# Patient Record
Sex: Male | Born: 1970 | Race: White | Hispanic: No | Marital: Married | Smoking: Never smoker
Health system: Southern US, Community
[De-identification: ages and names within clinical notes are randomized; demographics above are authoritative.]

## PROBLEM LIST (undated history)

## (undated) HISTORY — PX: VASECTOMY: SHX75

---

## 2005-09-21 ENCOUNTER — Emergency Department (HOSPITAL_COMMUNITY): Admission: EM | Admit: 2005-09-21 | Discharge: 2005-09-21 | Payer: Self-pay | Admitting: Family Medicine

## 2005-09-21 ENCOUNTER — Emergency Department (HOSPITAL_COMMUNITY): Admission: EM | Admit: 2005-09-21 | Discharge: 2005-09-21 | Payer: Self-pay | Admitting: *Deleted

## 2006-03-22 ENCOUNTER — Encounter: Admission: RE | Admit: 2006-03-22 | Discharge: 2006-03-22 | Payer: Self-pay | Admitting: Family Medicine

## 2009-08-13 ENCOUNTER — Emergency Department (HOSPITAL_COMMUNITY): Admission: EM | Admit: 2009-08-13 | Discharge: 2009-08-13 | Payer: Self-pay | Admitting: Family Medicine

## 2009-08-16 ENCOUNTER — Encounter: Admission: RE | Admit: 2009-08-16 | Discharge: 2009-08-16 | Payer: Self-pay | Admitting: Orthopedic Surgery

## 2011-05-18 IMAGING — CR DG KNEE COMPLETE 4+V*L*
4 series · 4 of 4 positions shown · non-contrast
Comparison: None

CLINICAL DATA: Pain

LEFT KNEE - COMPLETE 4+ VIEW

[view not recorded (1 of 4)]
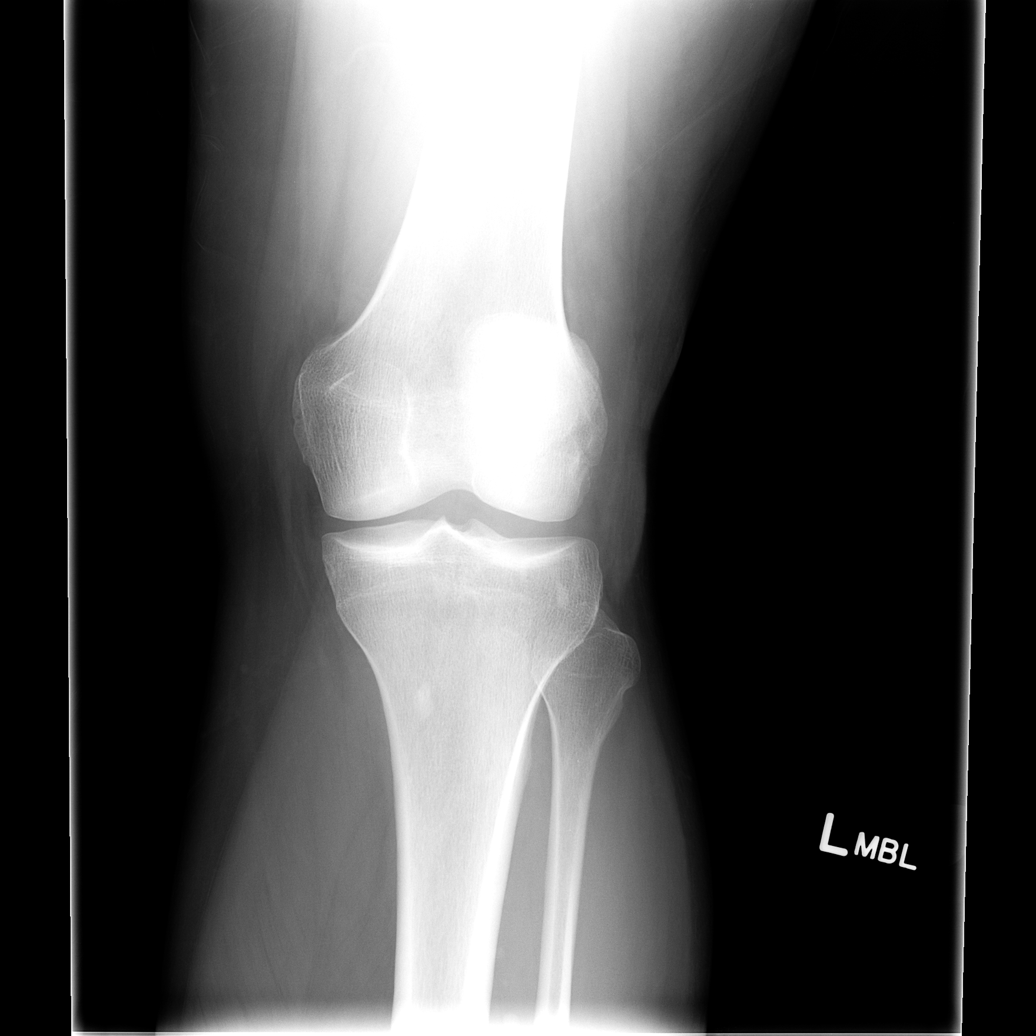

[view not recorded (2 of 4)]
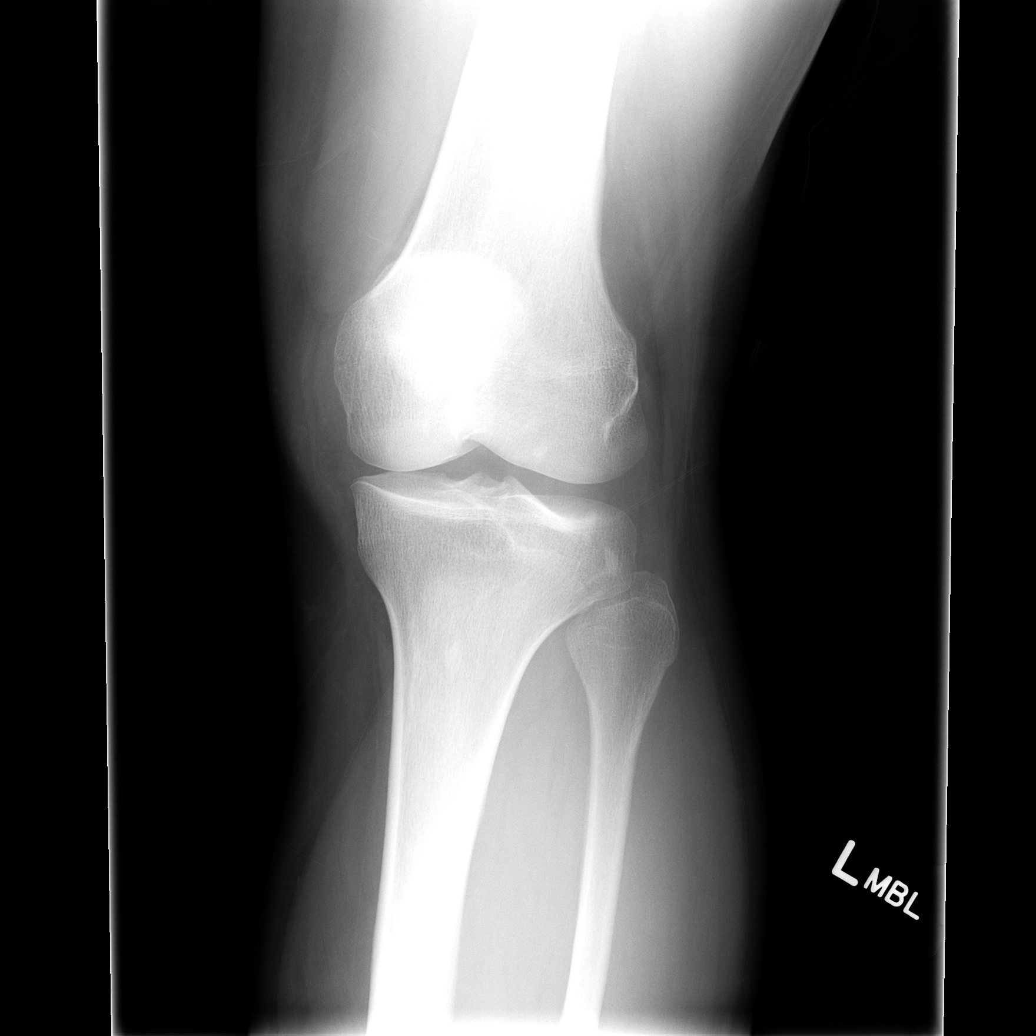

[view not recorded (3 of 4)]
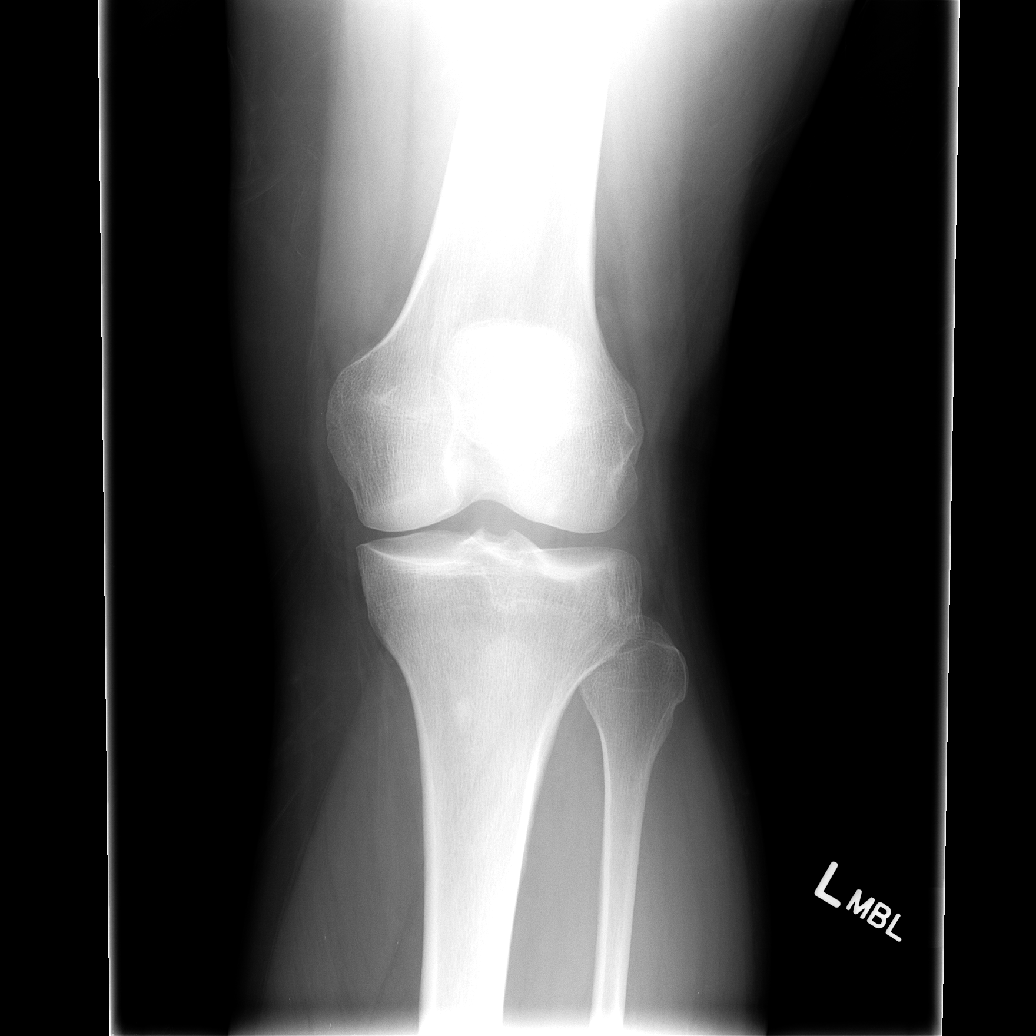

[view not recorded (4 of 4)]
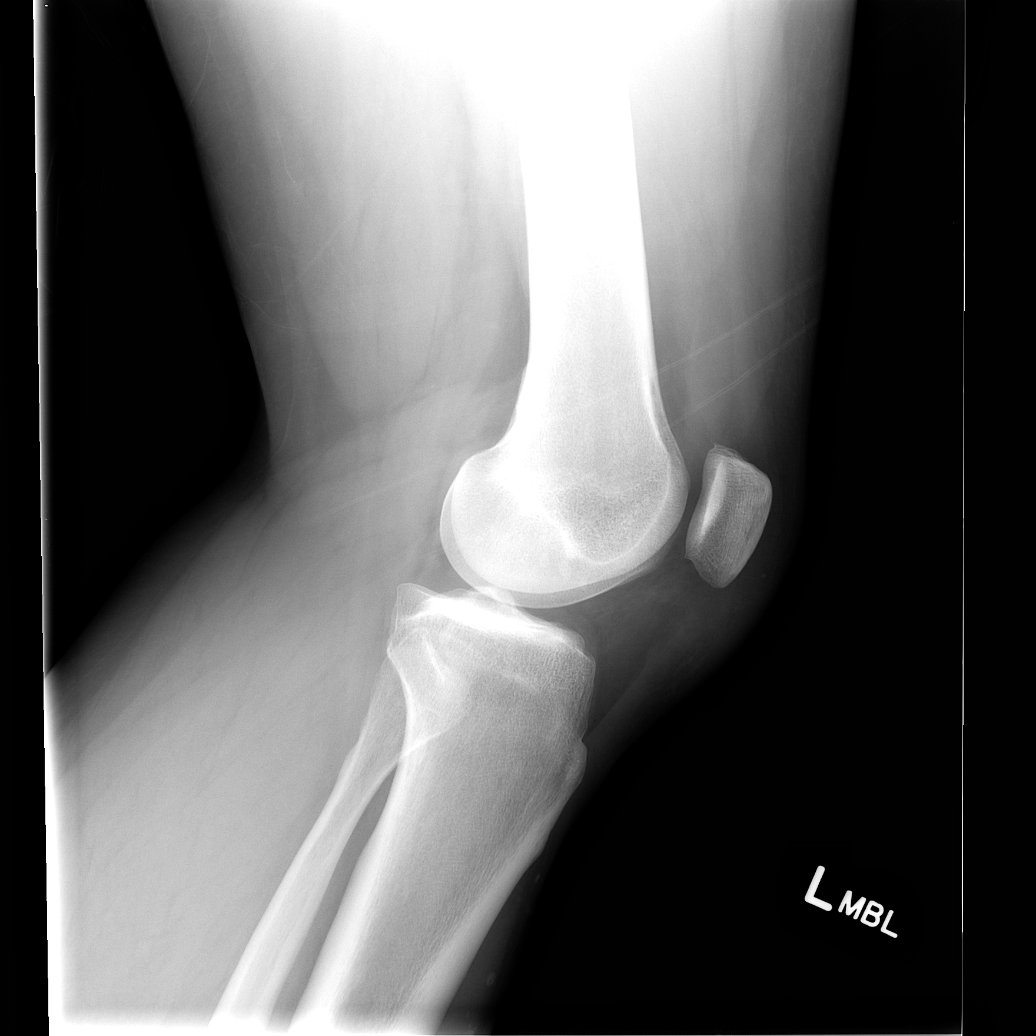

[4 of 4 positions shown; findings below may reference images not displayed]

FINDINGS: Four views of the left knee submitted.  No acute fracture
or subluxation.  No radiopaque foreign body.  No significant joint
effusion.
IMPRESSION: No acute fracture or subluxation.

## 2013-08-31 ENCOUNTER — Emergency Department (HOSPITAL_COMMUNITY)
Admission: EM | Admit: 2013-08-31 | Discharge: 2013-08-31 | Disposition: A | Discharge status: Home / Self Care | Payer: 59 | Attending: Emergency Medicine | Admitting: Emergency Medicine

## 2013-08-31 ENCOUNTER — Encounter (HOSPITAL_COMMUNITY): Payer: Self-pay | Admitting: Emergency Medicine

## 2013-08-31 DIAGNOSIS — Z7982 Long term (current) use of aspirin: Secondary | ICD-10-CM | POA: Insufficient documentation

## 2013-08-31 DIAGNOSIS — F410 Panic disorder [episodic paroxysmal anxiety] without agoraphobia: Secondary | ICD-10-CM | POA: Insufficient documentation

## 2013-08-31 DIAGNOSIS — R209 Unspecified disturbances of skin sensation: Secondary | ICD-10-CM | POA: Insufficient documentation

## 2013-08-31 DIAGNOSIS — R0602 Shortness of breath: Secondary | ICD-10-CM | POA: Insufficient documentation

## 2013-08-31 NOTE — ED Provider Notes (Signed)
CSN: [id number]     Arrival date & time 08/31/13  0147 History   First MD Initiated Contact with Patient 08/31/13 0158     Chief Complaint  Patient presents with  . Arm Pain   (Consider location/radiation/quality/duration/timing/severity/associated sxs/prior Treatment) HPI Comments: Had shortness of breath and left arm tingling while resting and getting ready for his week. He states a similar episode last week.   Patient is a 42 y.o. male presenting with arm pain. The history is provided by the patient.  Arm Pain This is a new problem. The current episode started 3 to 5 hours ago. Episode frequency: once. The problem has been resolved. Associated symptoms include shortness of breath (lasted about 15 minutes). Pertinent negatives include no chest pain, no abdominal pain and no headaches. Nothing aggravates the symptoms. Nothing relieves the symptoms.    No past medical history on file. No past surgical history on file. Family History  Problem Relation Age of Onset  . Cancer Mother   . Heart failure Father   . Hypertension Father    History  Substance Use Topics  . Smoking status: Never Smoker   . Smokeless tobacco: Never Used  . Alcohol Use: No    Review of Systems  Constitutional: Negative for fever.  Respiratory: Positive for shortness of breath (lasted about 15 minutes).   Cardiovascular: Negative for chest pain.  Gastrointestinal: Negative for abdominal pain.  Neurological: Negative for headaches.  All other systems reviewed and are negative.    Allergies  Review of patient's allergies indicates no known allergies.  Home Medications   Current Outpatient Rx  Name  Route  Sig  Dispense  Refill  . aspirin 325 MG tablet   Oral   Take 975 mg by mouth once.          BP 128/89  Pulse 82  Temp(Src) 98.5 F (36.9 C)  Resp 16  SpO2 97% Physical Exam  Nursing note and vitals reviewed. Constitutional: He is oriented to person, place, and time. He appears  well-developed and well-nourished. No distress.  HENT:  Head: Normocephalic and atraumatic.  Mouth/Throat: No oropharyngeal exudate.  Eyes: EOM are normal. Pupils are equal, round, and reactive to light.  Neck: Normal range of motion. Neck supple.  Cardiovascular: Normal rate and regular rhythm.  Exam reveals no friction rub.   No murmur heard. Pulmonary/Chest: Effort normal and breath sounds normal. No respiratory distress. He has no wheezes. He has no rales.  Abdominal: He exhibits no distension. There is no tenderness. There is no rebound.  Musculoskeletal: Normal range of motion. He exhibits no edema.  Neurological: He is alert and oriented to person, place, and time.  Skin: He is not diaphoretic.    ED Course  Procedures (including critical care time) Labs Review Labs Reviewed - No data to display Imaging Review No results found.  EKG Interpretation     Ventricular Rate:  73 PR Interval:  164 QRS Duration: 91 QT Interval:  374 QTC Calculation: 412 R Axis:   55 Text Interpretation:  Sinus rhythm Baseline wander in lead(s) V3            MDM   1. Panic attack    29-year-old male with no prior history presents with some shortness of breath and left arm tingling. Began all he was taking but is weak and relaxing. No chest pain, or shortness of breath that has since eased off. Last about 15 minutes. Tingling is very mildly tender, but is much  subsided. Patient's syncopal episode like this last week that lasted a longer amount of time. It is very similar with shortness of breath and left arm tingling. He denies any fever, vomiting, chills, cough. He has no risk factors for ACS other than family history and obesity. He is not a smoker. EKG is normal here. I feel he patient's symptoms are consistent with a panic attack. He wants to make sure it is not his heart, so I offered him serial troponin testing, however he wants to pursue a workup with his PCP. Instructed to followup  with PCP    Dagmar Hait, MD 08/31/13 [phone]

## 2013-08-31 NOTE — ED Notes (Signed)
Dr. Walden at bedside 

## 2013-08-31 NOTE — ED Notes (Signed)
Pt states left upper arm pain that radiates to left hand which is tingling. Left sensation is present. Full range of motion present with arm, elbow, wris,t and fingers. Pt states that he feels like he can not get full expansion of his lungs.

## 2014-11-06 ENCOUNTER — Encounter (HOSPITAL_COMMUNITY): Payer: Self-pay | Admitting: *Deleted

## 2014-11-06 ENCOUNTER — Emergency Department (HOSPITAL_COMMUNITY)
Admission: EM | Admit: 2014-11-06 | Discharge: 2014-11-06 | Disposition: A | Discharge status: Home / Self Care | Payer: 59 | Source: Home / Self Care | Attending: Emergency Medicine | Admitting: Emergency Medicine

## 2014-11-06 DIAGNOSIS — K5901 Slow transit constipation: Secondary | ICD-10-CM

## 2014-11-06 DIAGNOSIS — M545 Low back pain, unspecified: Secondary | ICD-10-CM

## 2014-11-06 DIAGNOSIS — S39012A Strain of muscle, fascia and tendon of lower back, initial encounter: Secondary | ICD-10-CM

## 2014-11-06 MED ORDER — KETOROLAC TROMETHAMINE 10 MG PO TABS: 10.0000 mg | ORAL_TABLET | Freq: Four times a day (QID) | ORAL | Status: AC | PRN

## 2014-11-06 MED ORDER — KETOROLAC TROMETHAMINE 60 MG/2ML IM SOLN
INTRAMUSCULAR | Status: AC
  Filled 2014-11-06: qty 2

## 2014-11-06 MED ORDER — KETOROLAC TROMETHAMINE 60 MG/2ML IM SOLN
60.0000 mg | Freq: Once | INTRAMUSCULAR | Status: AC
  Administered 2014-11-06: 60 mg via INTRAMUSCULAR

## 2014-11-06 MED ORDER — DICLOFENAC SODIUM 1 % TD GEL: 1.0000 "application " | Freq: Four times a day (QID) | TRANSDERMAL | Status: AC

## 2014-11-06 NOTE — ED Notes (Signed)
Pt   Reports  He  Has  Low  Back  Pain  For  About  1  Week         Pain  Worse  On  Movement  And  Certain posistions          denys  Any  specefic  Injury         He  Is   Constipated   No  bm  For   sev  Days     -  He  Reports  Taking pain meds  For the  Constipation    Recent  Vasectomy  But  denys  Any  Symptoms  Related  To that

## 2014-11-06 NOTE — Discharge Instructions (Signed)
Back Pain, Adult °Low back pain is very common. About 1 in 5 people have back pain. The cause of low back pain is rarely dangerous. The pain often gets better over time. About half of people with a sudden onset of back pain feel better in just 2 weeks. About 8 in 10 people feel better by 6 weeks.  °CAUSES °Some common causes of back pain include: °· Strain of the muscles or ligaments supporting the spine. °· Wear and tear (degeneration) of the spinal discs. °· Arthritis. °· Direct injury to the back. °DIAGNOSIS °Most of the time, the direct cause of low back pain is not known. However, back pain can be treated effectively even when the exact cause of the pain is unknown. Answering your caregiver's questions about your overall health and symptoms is one of the most accurate ways to make sure the cause of your pain is not dangerous. If your caregiver needs more information, he or she may order lab work or imaging tests (X-rays or MRIs). However, even if imaging tests show changes in your back, this usually does not require surgery. °HOME CARE INSTRUCTIONS °For many people, back pain returns. Since low back pain is rarely dangerous, it is often a condition that people can learn to manage on their own.  °· Remain active. It is stressful on the back to sit or stand in one place. Do not sit, drive, or stand in one place for more than 30 minutes at a time. Take short walks on level surfaces as soon as pain allows. Try to increase the length of time you walk each day. °· Do not stay in bed. Resting more than 1 or 2 days can delay your recovery. °· Do not avoid exercise or work. Your body is made to move. It is not dangerous to be active, even though your back may hurt. Your back will likely heal faster if you return to being active before your pain is gone. °· Pay attention to your body when you  bend and lift. Many people have less discomfort when lifting if they bend their knees, keep the load close to their bodies, and  avoid twisting. Often, the most comfortable positions are those that put less stress on your recovering back. °· Find a comfortable position to sleep. Use a firm mattress and lie on your side with your knees slightly bent. If you lie on your back, put a pillow under your knees. °· Only take over-the-counter or prescription medicines as directed by your caregiver. Over-the-counter medicines to reduce pain and inflammation are often the most helpful. Your caregiver may prescribe muscle relaxant drugs. These medicines help dull your pain so you can more quickly return to your normal activities and healthy exercise. °· Put ice on the injured area. °¨ Put ice in a plastic bag. °¨ Place a towel between your skin and the bag. °¨ Leave the ice on for 15-20 minutes, 03-04 times a day for the first 2 to 3 days. After that, ice and heat may be alternated to reduce pain and spasms. °· Ask your caregiver about trying back exercises and gentle massage. This may be of some benefit. °· Avoid feeling anxious or stressed. Stress increases muscle tension and can worsen back pain. It is important to recognize when you are anxious or stressed and learn ways to manage it. Exercise is a great option. °SEEK MEDICAL CARE IF: °· You have pain that is not relieved with rest or medicine. °· You have pain that does not improve in 1 week. °· You have new symptoms. °· You are generally not feeling well. °SEEK   IMMEDIATE MEDICAL CARE IF:   You have pain that radiates from your back into your legs.  You develop new bowel or bladder control problems.  You have unusual weakness or numbness in your arms or legs.  You develop nausea or vomiting.  You develop abdominal pain.  You feel faint. Document Released: 10/15/2005 Document Revised: 04/15/2012 Document Reviewed: 02/16/2014 [facility] Patient Information 2015 [place], [place]. This information is not intended to replace advice given to you by your health care provider. Make sure you  discuss any questions you have with your health care provider.  Constipation Miralax as directed, 2 glasses today and repeat every day as needed. Ducolax 1-2 tablets today then 1 a day as needed Lumbosacral Strain Lumbosacral strain is a strain of any of the parts that make up your lumbosacral vertebrae. Your lumbosacral vertebrae are the bones that make up the lower third of your backbone. Your lumbosacral vertebrae are held together by muscles and tough, fibrous tissue (ligaments).  CAUSES  A sudden blow to your back can cause lumbosacral strain. Also, anything that causes an excessive stretch of the muscles in the low back can cause this strain. This is typically seen when people exert themselves strenuously, fall, lift heavy objects, bend, or crouch repeatedly. RISK FACTORS  Physically demanding work.  Participation in pushing or pulling sports or sports that require a sudden twist of the back (tennis, golf, baseball).  Weight lifting.  Excessive lower back curvature.  Forward-tilted pelvis.  Weak back or abdominal muscles or both.  Tight hamstrings. SIGNS AND SYMPTOMS  Lumbosacral strain may cause pain in the area of your injury or pain that moves (radiates) down your leg.  DIAGNOSIS Your health care provider can often diagnose lumbosacral strain through a physical exam. In some cases, you may need tests such as X-ray exams.  TREATMENT  Treatment for your lower back injury depends on many factors that your clinician will have to evaluate. However, most treatment will include the use of anti-inflammatory medicines. HOME CARE INSTRUCTIONS   Avoid hard physical activities (tennis, racquetball, waterskiing) if you are not in proper physical condition for it. This may aggravate or create problems.  If you have a back problem, avoid sports requiring sudden body movements. Swimming and walking are generally safer activities.  Maintain good posture.  Maintain a healthy  weight.  For acute conditions, you may put ice on the injured area.  Put ice in a plastic bag.  Place a towel between your skin and the bag.  Leave the ice on for 20 minutes, 2-3 times a day.  When the low back starts healing, stretching and strengthening exercises may be recommended. SEEK MEDICAL CARE IF:  Your back pain is getting worse.  You experience severe back pain not relieved with medicines. SEEK IMMEDIATE MEDICAL CARE IF:   You have numbness, tingling, weakness, or problems with the use of your arms or legs.  There is a change in bowel or bladder control.  You have increasing pain in any area of the body, including your belly (abdomen).  You notice shortness of breath, dizziness, or feel faint.  You feel sick to your stomach (nauseous), are throwing up (vomiting), or become sweaty.  You notice discoloration of your toes or legs, or your feet get very cold. MAKE SURE YOU:   Understand these instructions.  Will watch your condition.  Will get help right away if you are not doing well or get worse. Document Released: 07/25/2005 Document Revised: 10/20/2013 Document Reviewed:  06/03/2013 [employer] Patient Information 2015 [place]. This information is not intended to replace advice given to you by your health care provider. Make sure you discuss any questions you have with your health care provider.  Constipation is when a person:  Poops (has a bowel movement) less than 3 times a week.  Has a hard time pooping.  Has poop that is dry, hard, or bigger than normal. HOME CARE   Eat foods with a lot of fiber in them. This includes fruits, vegetables, beans, and whole grains such as brown rice.  Avoid fatty foods and foods with a lot of sugar. This includes french fries, hamburgers, cookies, candy, and soda.  If you are not getting enough fiber from food, take products with added fiber in them (supplements).  Drink enough fluid to keep your pee (urine) clear  or pale yellow.  Exercise on a regular basis, or as told by your doctor.  Go to the restroom when you feel like you need to poop. Do not hold it.  Only take medicine as told by your doctor. Do not take medicines that help you poop (laxatives) without talking to your doctor first. GET HELP RIGHT AWAY IF:   You have bright red blood in your poop (stool).  Your constipation lasts more than 4 days or gets worse.  You have belly (abdominal) or butt (rectal) pain.  You have thin poop (as thin as a pencil).  You lose weight, and it cannot be explained. MAKE SURE YOU:   Understand these instructions.  Will watch your condition.  Will get help right away if you are not doing well or get worse. Document Released: 04/02/2008 Document Revised: 10/20/2013 Document Reviewed: 07/27/2013 [facility] Patient Information 2015 [place], [place]. This information is not intended to replace advice given to you by your health care provider. Make sure you discuss any questions you have with your health care provider.

## 2014-11-06 NOTE — ED Provider Notes (Signed)
CSN: [id number]     Arrival date & time 11/06/14  1458 History   First MD Initiated Contact with Patient 11/06/14 1628     Chief Complaint  Patient presents with  . Back Pain   (Consider location/radiation/quality/duration/timing/severity/associated sxs/prior Treatment) HPI Comments: 44 year old obese male complaining of back pain for one week. He saw his chiropractic sometime since the first of the week but apparently had little to no improvement. He does not understand why he has back pain. Denies trauma. He does state that just prior to the onset he had been assisting with hanging tram molding. His second concern is that of possible constipation. His last bowel movement was 3 days ago. 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 He states that he does not have pain from the vasectomy but he did take an oxycodone for his back pain and it helped. The pain is worse with movement and bending and especially trying to get up off the exam table for which she had much difficulty. Pain is located across the low mid lumbar spine.   History reviewed. No pertinent past medical history. Past Surgical History  Procedure Laterality Date  . Vasectomy     Family History  Problem Relation Age of Onset  . Cancer Mother   . Heart failure Father   . Hypertension Father    History  Substance Use Topics  . Smoking status: Never Smoker   . Smokeless tobacco: Never Used  . Alcohol Use: No    Review of Systems  Constitutional: Positive for activity change. Negative for fever.  HENT: Negative.   Respiratory: Negative for cough and shortness of breath.   Cardiovascular: Negative for chest pain and palpitations.  Gastrointestinal: Positive for constipation. Negative for nausea, vomiting and abdominal pain.  Genitourinary: Negative.   Musculoskeletal: Positive for back pain. Negative for joint swelling and neck pain.  Skin: Negative.   Neurological: Negative for syncope, weakness and  numbness.    Allergies  Review of patient's allergies indicates no known allergies.  Home Medications   Prior to Admission medications   Medication Sig Start Date End Date Taking? Authorizing Provider  Oxycodone-Acetaminophen (PERCOCET PO) Take by mouth.   Yes Historical Provider, MD  aspirin 325 MG tablet Take 975 mg by mouth once.    Historical Provider, MD  diclofenac sodium (VOLTAREN) 1 % GEL Apply 1 application topically 4 (four) times daily. 11/06/14   Hayden Rasmussen, NP  ketorolac (TORADOL) 10 MG tablet Take 1 tablet (10 mg total) by mouth 4 (four) times daily as needed. Take with food 11/06/14   Hayden Rasmussen, NP   BP 131/77 mmHg  Pulse 60  Temp(Src) 97.4 F (36.3 C) (Oral)  Resp 16  SpO2 99% Physical Exam  Constitutional: He is oriented to person, place, and time. He appears well-developed and well-nourished. No distress.  Neck: Normal range of motion. Neck supple.  Cardiovascular: Normal rate.   Pulmonary/Chest: Effort normal. No respiratory distress.  Musculoskeletal: He exhibits no edema.  Tenderness across the mid lumbar para musculature. No tenderness to the lumbar spine. No palpable deformity, crepitus or step-off deformity. No evidence of swelling or discoloration.  Neurological: He is alert and oriented to person, place, and time.  Skin: Skin is warm and dry.  Psychiatric: He has a normal mood and affect.  Nursing note and vitals reviewed.   ED Course  Procedures (including critical care time) Labs Review Labs Reviewed - No data to display  Imaging Review No  results found.   MDM   1. Lumbar strain, initial encounter   2. Bilateral low back pain without sciatica   3. Slow transit constipation    Miralax as directed, 2 glasses today and repeat every day as needed. Ducolax 1-2 tablets today then 1 a day as needed Toradol 60 mg IM now Diclo620fenac gel as dir Toradol 10 mg tabs as dir. With food. Take your Percocet for back pain as needed.    Hayden Rasmussenavid Eland Lamantia,  NP 11/06/14 [id number]

## 2018-09-09 DIAGNOSIS — M545 Low back pain: Secondary | ICD-10-CM | POA: Diagnosis not present

## 2018-09-09 DIAGNOSIS — M9903 Segmental and somatic dysfunction of lumbar region: Secondary | ICD-10-CM | POA: Diagnosis not present

## 2018-09-09 DIAGNOSIS — M5416 Radiculopathy, lumbar region: Secondary | ICD-10-CM | POA: Diagnosis not present

## 2018-09-10 DIAGNOSIS — M5416 Radiculopathy, lumbar region: Secondary | ICD-10-CM | POA: Diagnosis not present

## 2018-09-10 DIAGNOSIS — M545 Low back pain: Secondary | ICD-10-CM | POA: Diagnosis not present

## 2018-09-10 DIAGNOSIS — M9903 Segmental and somatic dysfunction of lumbar region: Secondary | ICD-10-CM | POA: Diagnosis not present

## 2018-09-15 DIAGNOSIS — M5416 Radiculopathy, lumbar region: Secondary | ICD-10-CM | POA: Diagnosis not present

## 2018-09-15 DIAGNOSIS — M545 Low back pain: Secondary | ICD-10-CM | POA: Diagnosis not present

## 2018-09-15 DIAGNOSIS — M9903 Segmental and somatic dysfunction of lumbar region: Secondary | ICD-10-CM | POA: Diagnosis not present

## 2018-09-16 DIAGNOSIS — M5416 Radiculopathy, lumbar region: Secondary | ICD-10-CM | POA: Diagnosis not present

## 2018-09-16 DIAGNOSIS — M545 Low back pain: Secondary | ICD-10-CM | POA: Diagnosis not present

## 2018-09-16 DIAGNOSIS — M9903 Segmental and somatic dysfunction of lumbar region: Secondary | ICD-10-CM | POA: Diagnosis not present

## 2018-09-17 DIAGNOSIS — M545 Low back pain: Secondary | ICD-10-CM | POA: Diagnosis not present

## 2018-09-17 DIAGNOSIS — M9903 Segmental and somatic dysfunction of lumbar region: Secondary | ICD-10-CM | POA: Diagnosis not present

## 2018-09-17 DIAGNOSIS — M5416 Radiculopathy, lumbar region: Secondary | ICD-10-CM | POA: Diagnosis not present

## 2019-02-02 DIAGNOSIS — R202 Paresthesia of skin: Secondary | ICD-10-CM | POA: Diagnosis not present
